# Patient Record
Sex: Male | Born: 1990 | Race: White | Hispanic: No | Marital: Single | Smoking: Never smoker
Health system: Southern US, Community
[De-identification: ages and names within clinical notes are randomized; demographics above are authoritative.]

## PROBLEM LIST (undated history)

## (undated) DIAGNOSIS — C4491 Basal cell carcinoma of skin, unspecified: Secondary | ICD-10-CM

---

## 2014-12-07 ENCOUNTER — Encounter (HOSPITAL_COMMUNITY): Payer: Self-pay | Admitting: Emergency Medicine

## 2014-12-07 ENCOUNTER — Emergency Department (HOSPITAL_COMMUNITY)
Admission: EM | Admit: 2014-12-07 | Discharge: 2014-12-07 | Disposition: A | Discharge status: Home / Self Care | Payer: Self-pay | Attending: Emergency Medicine | Admitting: Emergency Medicine

## 2014-12-07 ENCOUNTER — Emergency Department (HOSPITAL_COMMUNITY): Payer: Self-pay

## 2014-12-07 DIAGNOSIS — R109 Unspecified abdominal pain: Secondary | ICD-10-CM

## 2014-12-07 DIAGNOSIS — N390 Urinary tract infection, site not specified: Secondary | ICD-10-CM | POA: Insufficient documentation

## 2014-12-07 DIAGNOSIS — N23 Unspecified renal colic: Secondary | ICD-10-CM | POA: Insufficient documentation

## 2014-12-07 DIAGNOSIS — Z85828 Personal history of other malignant neoplasm of skin: Secondary | ICD-10-CM | POA: Insufficient documentation

## 2014-12-07 DIAGNOSIS — Z880 Allergy status to penicillin: Secondary | ICD-10-CM | POA: Insufficient documentation

## 2014-12-07 DIAGNOSIS — R319 Hematuria, unspecified: Secondary | ICD-10-CM

## 2014-12-07 HISTORY — DX: Basal cell carcinoma of skin, unspecified: C44.91

## 2014-12-07 LAB — URINALYSIS, ROUTINE W REFLEX MICROSCOPIC
BILIRUBIN URINE: NEGATIVE
Glucose, UA: NEGATIVE mg/dL
KETONES UR: 15 mg/dL — AB
Leukocytes, UA: NEGATIVE
Nitrite: NEGATIVE
PH: 5.5 (ref 5.0–8.0)
Protein, ur: NEGATIVE mg/dL
SPECIFIC GRAVITY, URINE: 1.024 (ref 1.005–1.030)

## 2014-12-07 LAB — COMPREHENSIVE METABOLIC PANEL
ALBUMIN: 4.7 g/dL (ref 3.5–5.0)
ALK PHOS: 51 U/L (ref 38–126)
ALT: 22 U/L (ref 17–63)
ANION GAP: 11 (ref 5–15)
AST: 24 U/L (ref 15–41)
BUN: 13 mg/dL (ref 6–20)
CHLORIDE: 105 mmol/L (ref 101–111)
CO2: 21 mmol/L — AB (ref 22–32)
Calcium: 9 mg/dL (ref 8.9–10.3)
Creatinine, Ser: 0.94 mg/dL (ref 0.61–1.24)
GFR calc Af Amer: >60 mL/min (ref >60)
GFR calc non Af Amer: >60 mL/min (ref >60)
GLUCOSE: 143 mg/dL — AB (ref 65–99)
POTASSIUM: 3.2 mmol/L — AB (ref 3.5–5.1)
SODIUM: 137 mmol/L (ref 135–145)
Total Bilirubin: 1.2 mg/dL (ref 0.3–1.2)
Total Protein: 7.7 g/dL (ref 6.5–8.1)

## 2014-12-07 LAB — URINE MICROSCOPIC-ADD ON

## 2014-12-07 LAB — CBC
HEMATOCRIT: 44 % (ref 39.0–52.0)
HEMOGLOBIN: 15.6 g/dL (ref 13.0–17.0)
MCH: 30.4 pg (ref 26.0–34.0)
MCHC: 35.5 g/dL (ref 30.0–36.0)
MCV: 85.6 fL (ref 78.0–100.0)
Platelets: 240 10*3/uL (ref 150–400)
RBC: 5.14 MIL/uL (ref 4.22–5.81)
RDW: 12 % (ref 11.5–15.5)
WBC: 10.5 10*3/uL (ref 4.0–10.5)

## 2014-12-07 LAB — LIPASE, BLOOD: LIPASE: 26 U/L (ref 11–51)

## 2014-12-07 MED ORDER — IBUPROFEN 800 MG PO TABS: 800.0000 mg | ORAL_TABLET | Freq: Three times a day (TID) | ORAL | Status: AC | PRN

## 2014-12-07 MED ORDER — SODIUM CHLORIDE 0.9 % IV BOLUS (SEPSIS)
1000.0000 mL | Freq: Once | INTRAVENOUS | Status: AC
  Administered 2014-12-07: 1000 mL via INTRAVENOUS

## 2014-12-07 MED ORDER — CIPROFLOXACIN HCL 500 MG PO TABS: 500.0000 mg | ORAL_TABLET | Freq: Two times a day (BID) | ORAL | Status: AC

## 2014-12-07 MED ORDER — KETOROLAC TROMETHAMINE 30 MG/ML IJ SOLN
30.0000 mg | Freq: Once | INTRAMUSCULAR | Status: AC
  Administered 2014-12-07: 30 mg via INTRAVENOUS
  Filled 2014-12-07: qty 1

## 2014-12-07 MED ORDER — HYDROMORPHONE HCL 1 MG/ML IJ SOLN
1.0000 mg | Freq: Once | INTRAMUSCULAR | Status: AC
  Administered 2014-12-07: 1 mg via INTRAVENOUS
  Filled 2014-12-07: qty 1

## 2014-12-07 MED ORDER — OXYCODONE-ACETAMINOPHEN 5-325 MG PO TABS: 1.0000 | ORAL_TABLET | Freq: Four times a day (QID) | ORAL | Status: AC | PRN

## 2014-12-07 MED ORDER — ONDANSETRON HCL 4 MG/2ML IJ SOLN
4.0000 mg | Freq: Once | INTRAMUSCULAR | Status: AC
  Administered 2014-12-07: 4 mg via INTRAVENOUS
  Filled 2014-12-07: qty 2

## 2014-12-07 NOTE — ED Notes (Addendum)
Per pt, states left abdominal pain wtat started around 1130-no N/V/D-dysuria, frequency

## 2014-12-07 NOTE — Discharge Instructions (Signed)
Return here as needed.  Follow up with the urologist provided.  Increase your fluid intake °

## 2014-12-07 NOTE — ED Notes (Signed)
Patient transported to CT 

## 2014-12-07 NOTE — ED Provider Notes (Signed)
CSN: [id number]     Arrival date & time 12/07/14  1525 History   First MD Initiated Contact with Patient 12/07/14 1607     Chief Complaint  Patient presents with  . Abdominal Pain     (Consider location/radiation/quality/duration/timing/severity/associated sxs/prior Treatment) HPI Patient presents to the emergency department with sudden onset of left-sided abdominal pain around 11:30 this morning.  The patient states that he had a sudden onset of pain is not associated with any other symptoms.  The patient states that he does not have any chest pain, shortness breath, weakness, dizziness, headache, blurred vision, back pain, fever, dysuria, incontinence, bloody stool, hematemesis, nausea, vomiting, diarrhea, anorexia or syncope.  Patient states that nothing seems make his condition, better or worse.  Patient states that palpation did seem to make his pain somewhat worse Past Medical History  Diagnosis Date  . Basal cell carcinoma    History reviewed. No pertinent past surgical history. No family history on file. Social History  Substance Use Topics  . Smoking status: Never Smoker   . Smokeless tobacco: None  . Alcohol Use: No    Review of Systems  All other systems negative except as documented in the HPI. All pertinent positives and negatives as reviewed in the HPI.  Allergies  Amoxicillin  Home Medications   Prior to Admission medications   Not on File   BP 106/59 mmHg  Pulse 97  Temp(Src) 98.3 F (36.8 C) (Oral)  Resp 17  SpO2 100% Physical Exam  Constitutional: He is oriented to person, place, and time. He appears well-developed and well-nourished. No distress.  HENT:  Head: Normocephalic and atraumatic.  Mouth/Throat: Oropharynx is clear and moist.  Eyes: Pupils are equal, round, and reactive to light.  Neck: Normal range of motion. Neck supple.  Cardiovascular: Normal rate, regular rhythm and normal heart sounds.  Exam reveals no gallop and no friction rub.    No murmur heard. Pulmonary/Chest: Effort normal and breath sounds normal. No respiratory distress. He has no wheezes.  Abdominal: Soft. Normal appearance and bowel sounds are normal. He exhibits no distension. There is no splenomegaly or hepatomegaly. There is generalized tenderness. There is guarding. There is no rigidity and no rebound. No hernia.  Neurological: He is alert and oriented to person, place, and time. He exhibits normal muscle tone. Coordination normal.  Skin: Skin is warm and dry. No rash noted. No erythema.  Psychiatric: He has a normal mood and affect. His behavior is normal.  Nursing note and vitals reviewed.   ED Course  Procedures (including critical care time) Labs Review Labs Reviewed  COMPREHENSIVE METABOLIC PANEL - Abnormal; Notable for the following:    Potassium 3.2 (*)    CO2 21 (*)    Glucose, Bld 143 (*)    All other components within normal limits  URINALYSIS, ROUTINE W REFLEX MICROSCOPIC (NOT AT [facility]) - Abnormal; Notable for the following:    APPearance CLOUDY (*)    Hgb urine dipstick MODERATE (*)    Ketones, ur 15 (*)    All other components within normal limits  URINE MICROSCOPIC-ADD ON - Abnormal; Notable for the following:    Squamous Epithelial / LPF 0-5 (*)    Bacteria, UA MANY (*)    All other components within normal limits  LIPASE, BLOOD  CBC    Imaging Review Ct Renal Stone Study  12/07/2014  CLINICAL DATA:  Abdominal pain for 1 day.  Urinary frequency/dysuria EXAM: CT ABDOMEN AND PELVIS WITHOUT CONTRAST TECHNIQUE:  Multidetector CT imaging of the abdomen and pelvis was performed following the standard protocol without oral or intravenous contrast material administration. COMPARISON:  None. FINDINGS: Lower chest:  Lung bases are clear. Hepatobiliary: No focal liver lesions are identified. Gallbladder wall is not appreciably thickened. No biliary duct dilatation. Pancreas: There is no pancreatic mass or inflammatory focus. Spleen: No  splenic lesions are identified. Adrenals/Urinary Tract: Adrenals appear normal bilaterally. Right kidney shows no evidence of mass or hydronephrosis. There is no right-sided renal or ureteral calculus. On the left, there is no mass. There is moderate hydronephrosis on the left. There is no demonstrable left-sided renal or ureteral calculus. The urinary bladder is in the midline with wall thickness within normal limits given the degree of bladder distention. Stomach/Bowel: There is a slight degree of stranding in the fat adjacent to the upper rectum. The upper rectal walls small week thickened. There is no other bowel wall thickening or mesenteric thickening. No bowel obstruction. No free air or portal venous air. Vascular/Lymphatic: No abdominal aortic aneurysm. No vascular lesion is identified on this noncontrast enhanced study. There is no adenopathy by size criteria in the abdomen or pelvis. A few inguinal lymph nodes are nonspecific and do not meet size criteria for pathologic significance. Other: Appendix appears normal. No abscess or ascites in the abdomen or pelvis. Reproductive: Prostate is normal in size and configuration. A single tiny prostatic calculus identified. No pelvic mass or pelvic fluid collection. 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 No blastic or lytic bone lesions. No intramuscular abdominal wall lesions. IMPRESSION: Moderate hydronephrosis in ureterectasis on the left without renal or ureteral calculus apparent. Suspect recent calculus passage. Pyelonephritis on the left could present in this manner and is a differential consideration. No renal abscess is seen on this noncontrast enhanced study. There is a single tiny prostatic calculus. Slight stranding in the upper rectal region fat and slight wall thickening of the rectal could indicate a mild degree of early proctitis. Bowel elsewhere appears normal. No evidence of fistula. No bowel obstruction. Appendix appears normal.   No abscess. Electronically Signed   By: Lowella Grip III M.D.   On: 12/07/2014 16:37   I have personally reviewed and evaluated these images and lab results as part of my medical decision-making.  Patient will be treated as if he had passed a small kidney stone or a infectious source or both.  The patient be referred to urology.  The patient is feeling completely better at this time following IV medications and advised him to return here as needed.  The patient agrees the plan and all questions were answered   Dalia Heading, PA-C [id number]  Delora Fuel, MD [id number]

## 2014-12-07 NOTE — ED Notes (Signed)
Pt informed this RN that he had a poppyseed bagel this morning and so he wants [place] to know that that may show up in his blood work.

## 2014-12-07 NOTE — ED Notes (Signed)
PA at bedside.

## 2014-12-09 LAB — URINE CULTURE: Culture: NO GROWTH

## 2016-10-24 IMAGING — CT CT RENAL STONE PROTOCOL
2 of 3 series · 15 of 34 positions shown, 17 images · non-contrast
Comparison: None.

CLINICAL DATA: Abdominal pain for 1 day.  Urinary frequency/dysuria

EXAM:
CT ABDOMEN AND PELVIS WITHOUT CONTRAST
TECHNIQUE: Multidetector CT imaging of the abdomen and pelvis was performed
following the standard protocol without oral or intravenous contrast
material administration.

[Series 3: coronal · coronal · 0.58mm/px · 3 of 76 slices shown]
[im 26/76  soft-tissue]
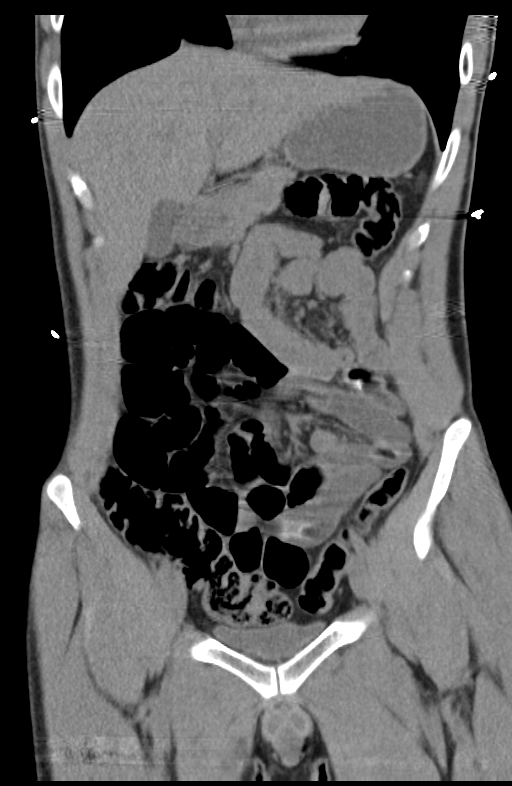
[im 34/76  soft-tissue]
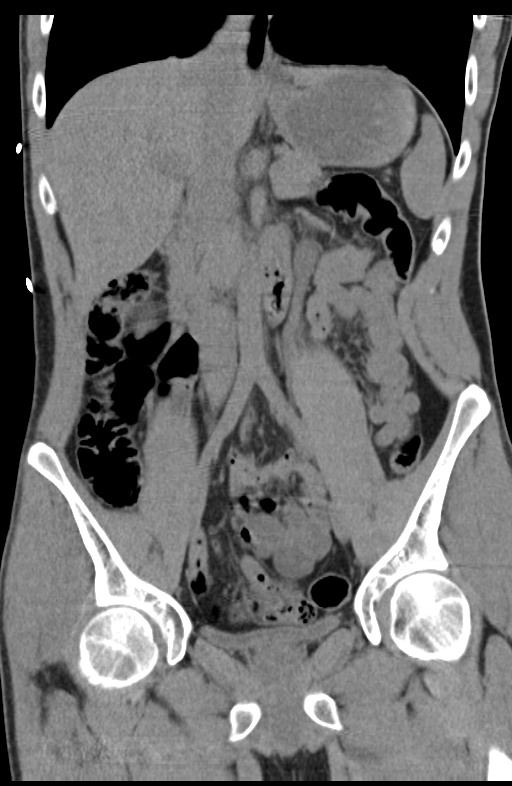
[im 42/76  soft-tissue]
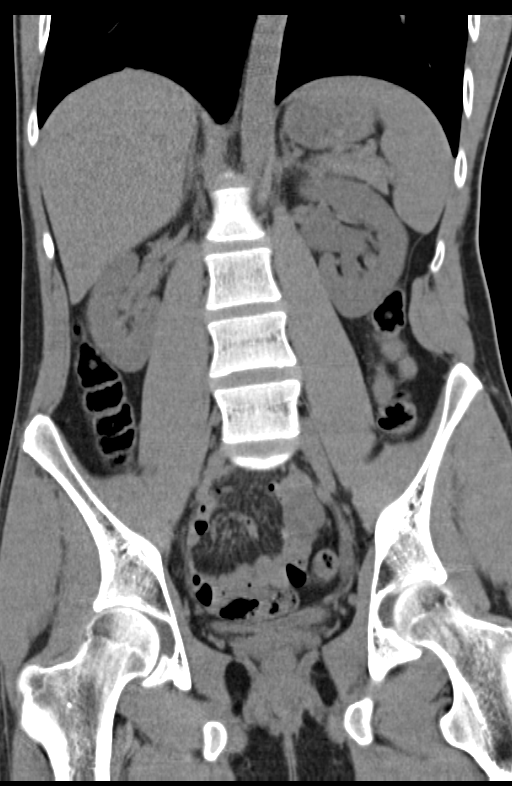

[Series 6: lung · axial · 0.64mm/px · z∈[+1248,+1332]mm · 12 of 20 slices shown, 14 images]
[im 2/20  soft-tissue]
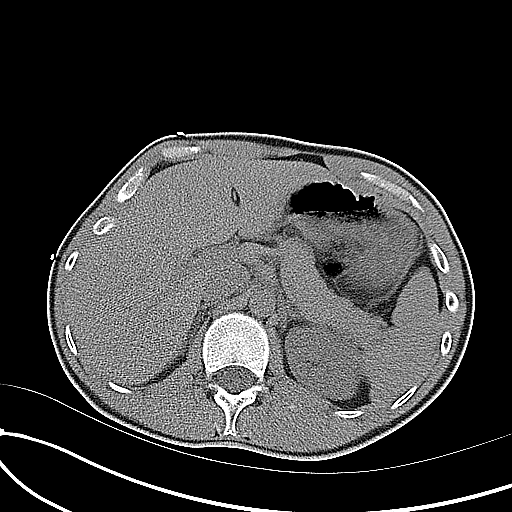
[im 2/20  bone]
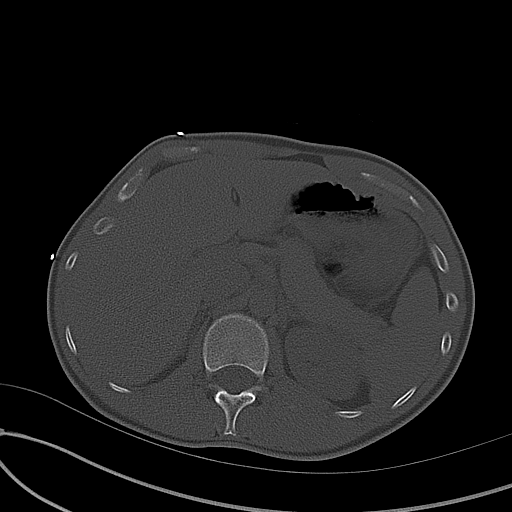
[im 4/20  soft-tissue]
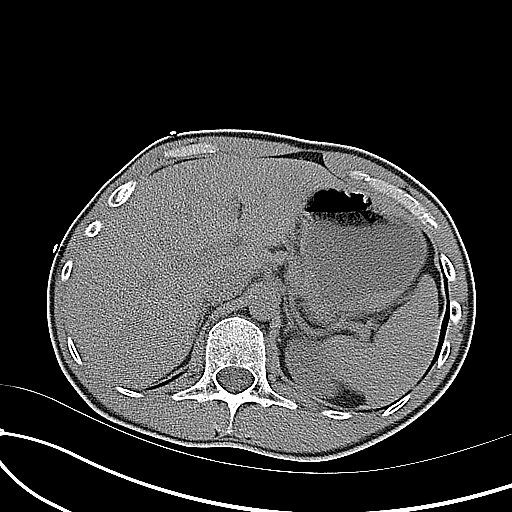
[im 5/20  soft-tissue]
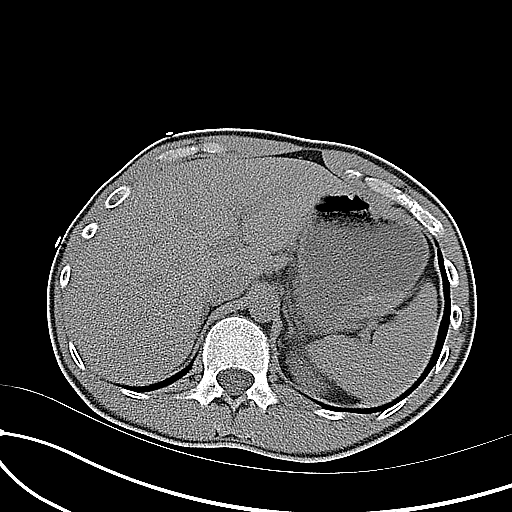
[im 7/20  soft-tissue]
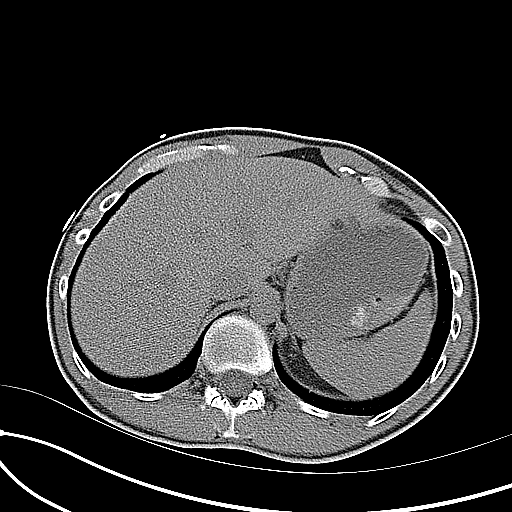
[im 8/20  soft-tissue]
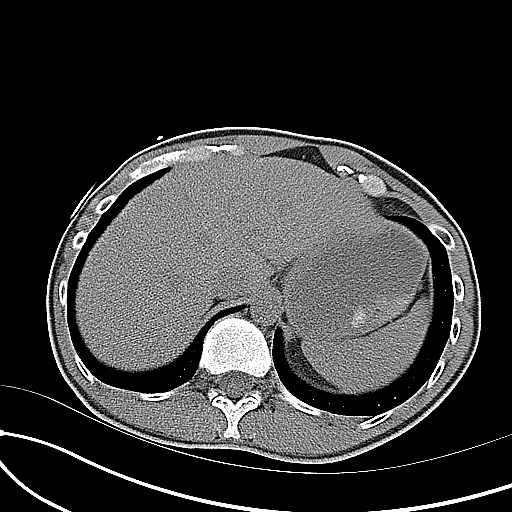
[im 10/20  soft-tissue]
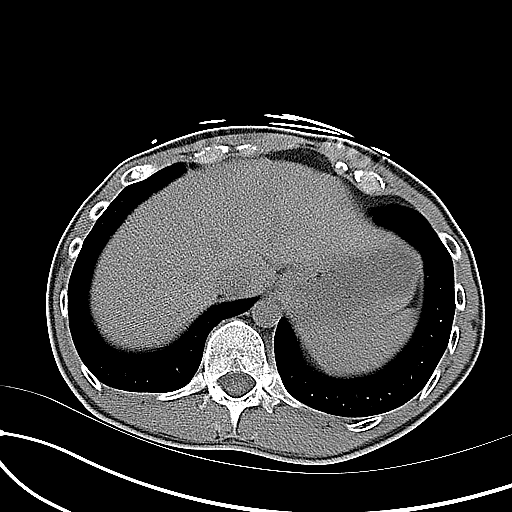
[im 11/20  soft-tissue]
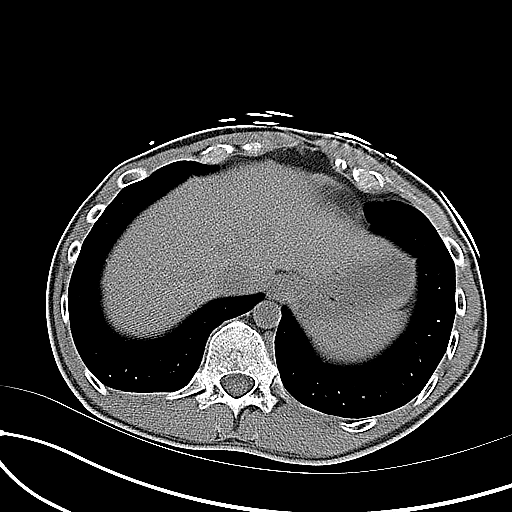
[im 13/20  soft-tissue]
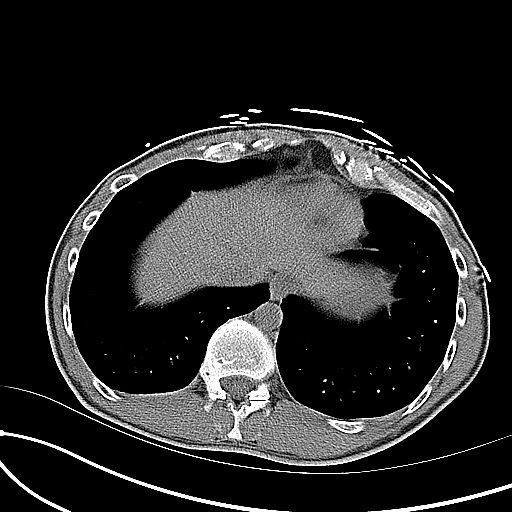
[im 14/20  soft-tissue]
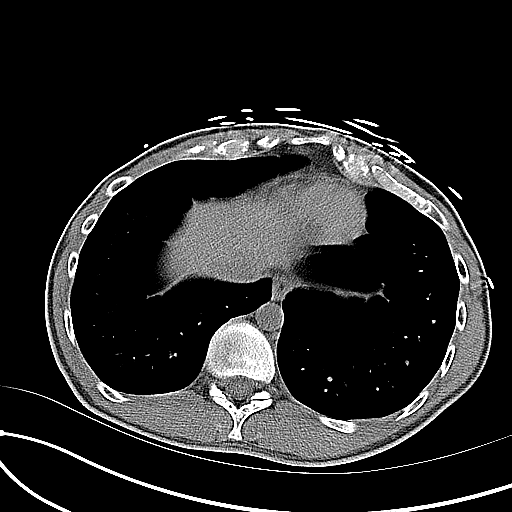
[im 14/20  bone]
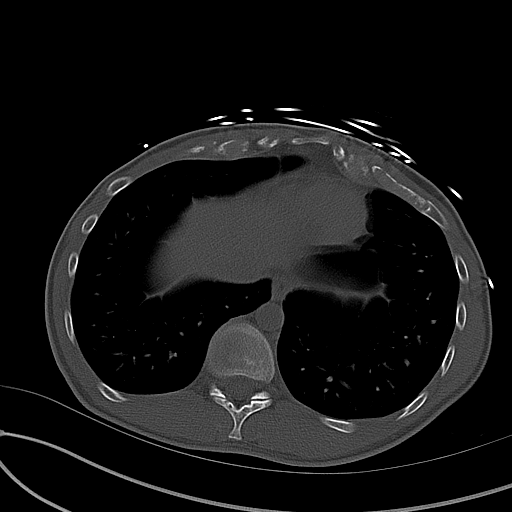
[im 16/20  soft-tissue]
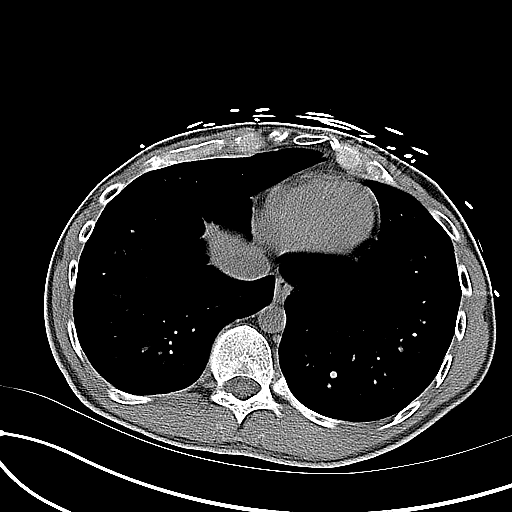
[im 17/20  soft-tissue]
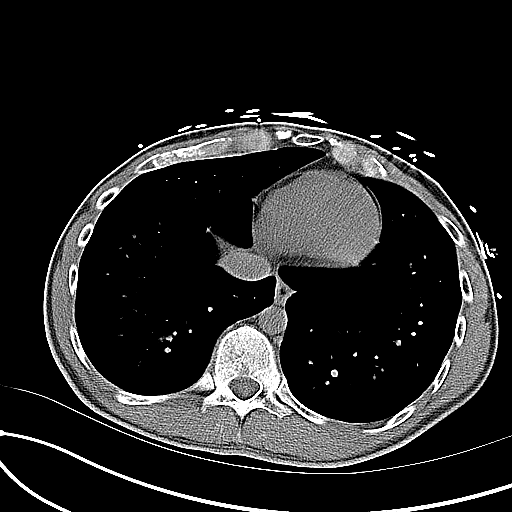
[im 19/20  soft-tissue]
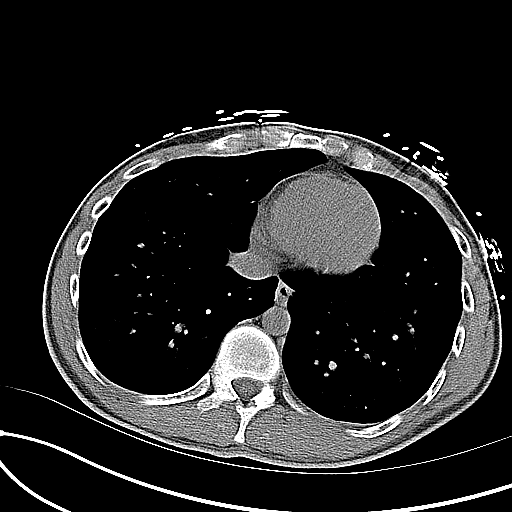

[15 of 34 positions shown; findings below may reference images not displayed]

FINDINGS: Lower chest:  Lung bases are clear.

Hepatobiliary: No focal liver lesions are identified. Gallbladder
wall is not appreciably thickened. No biliary duct dilatation.

Pancreas: There is no pancreatic mass or inflammatory focus.

Spleen: No splenic lesions are identified.

Adrenals/Urinary Tract: Adrenals appear normal bilaterally. Right
kidney shows no evidence of mass or hydronephrosis. There is no
right-sided renal or ureteral calculus. On the left, there is no
mass. There is moderate hydronephrosis on the left. There is no
demonstrable left-sided renal or ureteral calculus. The urinary
bladder is in the midline with wall thickness within normal limits
given the degree of bladder distention.

Stomach/Bowel: There is a slight degree of stranding in the fat
adjacent to the upper rectum. The upper rectal walls small week
thickened. There is no other bowel wall thickening or mesenteric
thickening. No bowel obstruction. No free air or portal venous air.

Vascular/Lymphatic: No abdominal aortic aneurysm. No vascular lesion
is identified on this noncontrast enhanced study. There is no
adenopathy by size criteria in the abdomen or pelvis. A few inguinal
lymph nodes are nonspecific and do not meet size criteria for
pathologic significance.

Other: Appendix appears normal. No abscess or ascites in the abdomen
or pelvis.

Reproductive: Prostate is normal in size and configuration. A single
tiny prostatic calculus identified. No pelvic mass or pelvic fluid
collection.

Musculoskeletal: There is thoracolumbar levoscoliosis. No blastic or
lytic bone lesions. No intramuscular abdominal wall lesions.
IMPRESSION: Moderate hydronephrosis in ureterectasis on the left without renal
or ureteral calculus apparent. Suspect recent calculus passage.
Pyelonephritis on the left could present in this manner and is a
differential consideration. No renal abscess is seen on this
noncontrast enhanced study.

There is a single tiny prostatic calculus.

Slight stranding in the upper rectal region fat and slight wall
thickening of the rectal could indicate a mild degree of early
proctitis. Bowel elsewhere appears normal. No evidence of fistula.
No bowel obstruction.

Appendix appears normal.  No abscess.
